# Patient Record
Sex: Male | Born: 1965 | Race: White | Hispanic: No | Marital: Married
Health system: Southern US, Community
[De-identification: ages and names within clinical notes are randomized; demographics above are authoritative.]

---

## 2019-02-28 ENCOUNTER — Other Ambulatory Visit (HOSPITAL_COMMUNITY): Payer: BC Managed Care – PPO

## 2019-02-28 ENCOUNTER — Inpatient Hospital Stay
Admit: 2019-02-28 | Discharge: 2019-04-23 | Disposition: A | Discharge status: Rehabilitation Facility | Payer: BC Managed Care – PPO | Source: Other Acute Inpatient Hospital | Attending: Internal Medicine | Admitting: Internal Medicine

## 2019-02-28 DIAGNOSIS — R609 Edema, unspecified: Secondary | ICD-10-CM

## 2019-02-28 DIAGNOSIS — K760 Fatty (change of) liver, not elsewhere classified: Secondary | ICD-10-CM

## 2019-02-28 DIAGNOSIS — R208 Other disturbances of skin sensation: Secondary | ICD-10-CM

## 2019-02-28 DIAGNOSIS — Z931 Gastrostomy status: Secondary | ICD-10-CM

## 2019-02-28 DIAGNOSIS — J189 Pneumonia, unspecified organism: Secondary | ICD-10-CM

## 2019-02-28 DIAGNOSIS — Z4659 Encounter for fitting and adjustment of other gastrointestinal appliance and device: Secondary | ICD-10-CM

## 2019-02-28 DIAGNOSIS — N289 Disorder of kidney and ureter, unspecified: Secondary | ICD-10-CM

## 2019-02-28 LAB — BLOOD GAS, ARTERIAL
Acid-Base Excess: 4.8 mmol/L — ABNORMAL HIGH (ref 0.0–2.0)
Bicarbonate: 28.9 mmol/L — ABNORMAL HIGH (ref 20.0–28.0)
O2 Content: 2 L/min
O2 Saturation: 94.7 %
Patient temperature: 98.6
pCO2 arterial: 43.9 mmHg (ref 32.0–48.0)
pH, Arterial: 7.434 (ref 7.350–7.450)
pO2, Arterial: 76.3 mmHg — ABNORMAL LOW (ref 83.0–108.0)

## 2019-03-01 ENCOUNTER — Other Ambulatory Visit (HOSPITAL_COMMUNITY): Payer: BC Managed Care – PPO

## 2019-03-01 LAB — COMPREHENSIVE METABOLIC PANEL
ALT: 62 U/L — ABNORMAL HIGH (ref 0–44)
AST: 126 U/L — ABNORMAL HIGH (ref 15–41)
Albumin: 1.5 g/dL — ABNORMAL LOW (ref 3.5–5.0)
Alkaline Phosphatase: 80 U/L (ref 38–126)
Anion gap: 4 — ABNORMAL LOW (ref 5–15)
BUN: 25 mg/dL — ABNORMAL HIGH (ref 6–20)
CO2: 27 mmol/L (ref 22–32)
Calcium: 9.3 mg/dL (ref 8.9–10.3)
Chloride: 114 mmol/L — ABNORMAL HIGH (ref 98–111)
Creatinine, Ser: 0.53 mg/dL — ABNORMAL LOW (ref 0.61–1.24)
GFR calc Af Amer: >60 mL/min (ref >60)
GFR calc non Af Amer: >60 mL/min (ref >60)
Glucose, Bld: 122 mg/dL — ABNORMAL HIGH (ref 70–99)
Potassium: 4.3 mmol/L (ref 3.5–5.1)
Sodium: 145 mmol/L (ref 135–145)
Total Bilirubin: 4.7 mg/dL — ABNORMAL HIGH (ref 0.3–1.2)
Total Protein: 7.6 g/dL (ref 6.5–8.1)

## 2019-03-01 LAB — URINALYSIS, ROUTINE W REFLEX MICROSCOPIC
Bilirubin Urine: NEGATIVE
Glucose, UA: NEGATIVE mg/dL
Ketones, ur: NEGATIVE mg/dL
Nitrite: NEGATIVE
Protein, ur: NEGATIVE mg/dL
Specific Gravity, Urine: 1.018 (ref 1.005–1.030)
pH: 7 (ref 5.0–8.0)

## 2019-03-01 LAB — CBC
HCT: 39.8 % (ref 39.0–52.0)
Hemoglobin: 13.2 g/dL (ref 13.0–17.0)
MCH: 33.2 pg (ref 26.0–34.0)
MCHC: 33.2 g/dL (ref 30.0–36.0)
MCV: 100 fL (ref 80.0–100.0)
Platelets: 129 10*3/uL — ABNORMAL LOW (ref 150–400)
RBC: 3.98 MIL/uL — ABNORMAL LOW (ref 4.22–5.81)
RDW: 16.4 % — ABNORMAL HIGH (ref 11.5–15.5)
WBC: 6.1 10*3/uL (ref 4.0–10.5)
nRBC: 0 % (ref 0.0–0.2)

## 2019-03-01 LAB — BLOOD GAS, ARTERIAL
Acid-Base Excess: 5.4 mmol/L — ABNORMAL HIGH (ref 0.0–2.0)
Bicarbonate: 29 mmol/L — ABNORMAL HIGH (ref 20.0–28.0)
O2 Content: 2 L/min
O2 Saturation: 92 %
Patient temperature: 98.6
pCO2 arterial: 38.9 mmHg (ref 32.0–48.0)
pH, Arterial: 7.485 — ABNORMAL HIGH (ref 7.350–7.450)
pO2, Arterial: 63 mmHg — ABNORMAL LOW (ref 83.0–108.0)

## 2019-03-01 LAB — AMMONIA: Ammonia: 133 umol/L — ABNORMAL HIGH (ref 9–35)

## 2019-03-01 LAB — PROTIME-INR
INR: 1.4 — ABNORMAL HIGH (ref 0.8–1.2)
Prothrombin Time: 17.1 seconds — ABNORMAL HIGH (ref 11.4–15.2)

## 2019-03-02 LAB — CBC
HCT: 41.2 % (ref 39.0–52.0)
Hemoglobin: 13.3 g/dL (ref 13.0–17.0)
MCH: 33.1 pg (ref 26.0–34.0)
MCHC: 32.3 g/dL (ref 30.0–36.0)
MCV: 102.5 fL — ABNORMAL HIGH (ref 80.0–100.0)
Platelets: 133 10*3/uL — ABNORMAL LOW (ref 150–400)
RBC: 4.02 MIL/uL — ABNORMAL LOW (ref 4.22–5.81)
RDW: 17 % — ABNORMAL HIGH (ref 11.5–15.5)
WBC: 6.3 10*3/uL (ref 4.0–10.5)
nRBC: 0 % (ref 0.0–0.2)

## 2019-03-02 LAB — BASIC METABOLIC PANEL
Anion gap: 6 (ref 5–15)
BUN: 24 mg/dL — ABNORMAL HIGH (ref 6–20)
CO2: 25 mmol/L (ref 22–32)
Calcium: 9.8 mg/dL (ref 8.9–10.3)
Chloride: 116 mmol/L — ABNORMAL HIGH (ref 98–111)
Creatinine, Ser: 0.59 mg/dL — ABNORMAL LOW (ref 0.61–1.24)
GFR calc Af Amer: >60 mL/min (ref >60)
GFR calc non Af Amer: >60 mL/min (ref >60)
Glucose, Bld: 136 mg/dL — ABNORMAL HIGH (ref 70–99)
Potassium: 4 mmol/L (ref 3.5–5.1)
Sodium: 147 mmol/L — ABNORMAL HIGH (ref 135–145)

## 2019-03-02 LAB — HEMOGLOBIN A1C
Hgb A1c MFr Bld: 5.5 % (ref 4.8–5.6)
Mean Plasma Glucose: 111.15 mg/dL

## 2019-03-02 LAB — AMMONIA: Ammonia: 142 umol/L — ABNORMAL HIGH (ref 9–35)

## 2019-03-02 LAB — MAGNESIUM: Magnesium: 1.5 mg/dL — ABNORMAL LOW (ref 1.7–2.4)

## 2019-03-02 LAB — PHOSPHORUS: Phosphorus: 3 mg/dL (ref 2.5–4.6)

## 2019-03-02 LAB — TSH: TSH: 5.113 u[IU]/mL — ABNORMAL HIGH (ref 0.350–4.500)

## 2019-03-03 ENCOUNTER — Other Ambulatory Visit (HOSPITAL_COMMUNITY): Payer: BC Managed Care – PPO

## 2019-03-03 ENCOUNTER — Other Ambulatory Visit (HOSPITAL_BASED_OUTPATIENT_CLINIC_OR_DEPARTMENT_OTHER): Payer: BC Managed Care – PPO

## 2019-03-03 DIAGNOSIS — R7881 Bacteremia: Secondary | ICD-10-CM | POA: Diagnosis not present

## 2019-03-03 LAB — CBC
HCT: 42.7 % (ref 39.0–52.0)
Hemoglobin: 13.9 g/dL (ref 13.0–17.0)
MCH: 33.4 pg (ref 26.0–34.0)
MCHC: 32.6 g/dL (ref 30.0–36.0)
MCV: 102.6 fL — ABNORMAL HIGH (ref 80.0–100.0)
Platelets: 129 10*3/uL — ABNORMAL LOW (ref 150–400)
RBC: 4.16 MIL/uL — ABNORMAL LOW (ref 4.22–5.81)
RDW: 17.2 % — ABNORMAL HIGH (ref 11.5–15.5)
WBC: 7.8 10*3/uL (ref 4.0–10.5)
nRBC: 0 % (ref 0.0–0.2)

## 2019-03-03 LAB — BLOOD CULTURE ID PANEL (REFLEXED)

## 2019-03-03 LAB — BASIC METABOLIC PANEL
Anion gap: 8 (ref 5–15)
BUN: 19 mg/dL (ref 6–20)
CO2: 24 mmol/L (ref 22–32)
Calcium: 10 mg/dL (ref 8.9–10.3)
Chloride: 114 mmol/L — ABNORMAL HIGH (ref 98–111)
Creatinine, Ser: 0.56 mg/dL — ABNORMAL LOW (ref 0.61–1.24)
GFR calc Af Amer: >60 mL/min (ref >60)
GFR calc non Af Amer: >60 mL/min (ref >60)
Glucose, Bld: 134 mg/dL — ABNORMAL HIGH (ref 70–99)
Potassium: 4 mmol/L (ref 3.5–5.1)
Sodium: 146 mmol/L — ABNORMAL HIGH (ref 135–145)

## 2019-03-03 LAB — AMMONIA: Ammonia: 134 umol/L — ABNORMAL HIGH (ref 9–35)

## 2019-03-03 LAB — PHOSPHORUS: Phosphorus: 3.4 mg/dL (ref 2.5–4.6)

## 2019-03-03 LAB — MAGNESIUM: Magnesium: 1.4 mg/dL — ABNORMAL LOW (ref 1.7–2.4)

## 2019-03-03 MED ORDER — PANTOPRAZOLE SODIUM 40 MG IV SOLR
40.00 | INTRAVENOUS | Status: DC
Start: 2019-03-01 — End: 2019-03-03

## 2019-03-03 MED ORDER — LIDOCAINE VISCOUS HCL 2 % MT SOLN
15.00 | OROMUCOSAL | Status: DC
Start: ? — End: 2019-03-03

## 2019-03-03 MED ORDER — HYDRALAZINE HCL 20 MG/ML IJ SOLN
10.00 | INTRAMUSCULAR | Status: DC
Start: ? — End: 2019-03-03

## 2019-03-03 MED ORDER — DEXTROSE 10 % IV SOLN
250.00 | INTRAVENOUS | Status: DC
Start: ? — End: 2019-03-03

## 2019-03-03 MED ORDER — BISACODYL 10 MG RE SUPP
10.00 | RECTAL | Status: DC
Start: ? — End: 2019-03-03

## 2019-03-03 MED ORDER — ONDANSETRON 4 MG PO TBDP
4.00 | ORAL_TABLET | ORAL | Status: DC
Start: ? — End: 2019-03-03

## 2019-03-03 MED ORDER — MULTI-VITAMIN PO TABS
1.00 | ORAL_TABLET | ORAL | Status: DC
Start: 2019-03-01 — End: 2019-03-03

## 2019-03-03 MED ORDER — HEPARIN SODIUM (PORCINE) 5000 UNIT/ML IJ SOLN
7500.00 | INTRAMUSCULAR | Status: DC
Start: 2019-02-28 — End: 2019-03-03

## 2019-03-03 MED ORDER — DEXTROSE 5 % IV SOLN
INTRAVENOUS | Status: DC
Start: ? — End: 2019-03-03

## 2019-03-03 MED ORDER — GENERIC EXTERNAL MEDICATION
Status: DC
Start: ? — End: 2019-03-03

## 2019-03-03 MED ORDER — VITAMIN B-1 100 MG PO TABS
100.00 | ORAL_TABLET | ORAL | Status: DC
Start: 2019-03-01 — End: 2019-03-03

## 2019-03-03 MED ORDER — SCOPOLAMINE 1 MG/3DAYS TD PT72
1.50 | MEDICATED_PATCH | TRANSDERMAL | Status: DC
Start: 2019-03-03 — End: 2019-03-03

## 2019-03-03 MED ORDER — PROSOURCE TF PO LIQD
90.00 | ORAL | Status: DC
Start: 2019-02-28 — End: 2019-03-03

## 2019-03-03 MED ORDER — MAGNESIUM SULFATE 4 GM/100ML IV SOLN
4.00 | INTRAVENOUS | Status: DC
Start: ? — End: 2019-03-03

## 2019-03-03 MED ORDER — HYOSCYAMINE SULFATE 0.125 MG/ML PO SOLN
0.13 | ORAL | Status: DC
Start: ? — End: 2019-03-03

## 2019-03-03 MED ORDER — GENERIC EXTERNAL MEDICATION
0.20 | Status: DC
Start: ? — End: 2019-03-03

## 2019-03-03 MED ORDER — ATROPINE SULFATE 0.5 MG/5ML IJ SOSY
0.50 | PREFILLED_SYRINGE | INTRAMUSCULAR | Status: DC
Start: ? — End: 2019-03-03

## 2019-03-03 MED ORDER — LACTULOSE 10 GM/15ML PO SOLN
30.00 | ORAL | Status: DC
Start: 2019-02-28 — End: 2019-03-03

## 2019-03-03 MED ORDER — LABETALOL HCL 5 MG/ML IV SOLN
10.00 | INTRAVENOUS | Status: DC
Start: ? — End: 2019-03-03

## 2019-03-03 MED ORDER — GENERIC EXTERNAL MEDICATION
12.00 | Status: DC
Start: ? — End: 2019-03-03

## 2019-03-03 MED ORDER — ONDANSETRON HCL 4 MG/2ML IJ SOLN
4.00 | INTRAMUSCULAR | Status: DC
Start: ? — End: 2019-03-03

## 2019-03-03 MED ORDER — GENERIC EXTERNAL MEDICATION
1.00 | Status: DC
Start: ? — End: 2019-03-03

## 2019-03-03 MED ORDER — SENNOSIDES-DOCUSATE SODIUM 8.6-50 MG PO TABS
1.00 | ORAL_TABLET | ORAL | Status: DC
Start: 2019-03-01 — End: 2019-03-03

## 2019-03-03 MED ORDER — EPINEPHRINE 1 MG/10ML IJ SOSY
1.00 | PREFILLED_SYRINGE | INTRAMUSCULAR | Status: DC
Start: ? — End: 2019-03-03

## 2019-03-03 MED ORDER — FOLIC ACID 1 MG PO TABS
1.00 | ORAL_TABLET | ORAL | Status: DC
Start: 2019-03-01 — End: 2019-03-03

## 2019-03-03 MED ORDER — SODIUM CHLORIDE 0.9 % IV SOLN
20.00 | INTRAVENOUS | Status: DC
Start: ? — End: 2019-03-03

## 2019-03-03 MED ORDER — RIFAXIMIN 550 MG PO TABS
550.00 | ORAL_TABLET | ORAL | Status: DC
Start: 2019-02-28 — End: 2019-03-03

## 2019-03-03 MED ORDER — IBUPROFEN 100 MG/5ML PO SUSP
800.00 | ORAL | Status: DC
Start: ? — End: 2019-03-03

## 2019-03-03 MED ORDER — INSULIN ASPART 100 UNIT/ML FLEXPEN
0.00 | PEN_INJECTOR | SUBCUTANEOUS | Status: DC
Start: 2019-02-28 — End: 2019-03-03

## 2019-03-03 MED ORDER — GENERIC EXTERNAL MEDICATION
4000.00 | Status: DC
Start: ? — End: 2019-03-03

## 2019-03-03 MED ORDER — DERMACERIN EX CREA
TOPICAL_CREAM | CUTANEOUS | Status: DC
Start: ? — End: 2019-03-03

## 2019-03-03 NOTE — Progress Notes (Signed)
  Echocardiogram 2D Echocardiogram has been performed.  Bobby Roach 03/03/2019, 5:21 PM

## 2019-03-04 LAB — CULTURE, BLOOD (ROUTINE X 2)

## 2019-03-04 LAB — BASIC METABOLIC PANEL
Anion gap: 5 (ref 5–15)
BUN: 20 mg/dL (ref 6–20)
CO2: 24 mmol/L (ref 22–32)
Calcium: 9.7 mg/dL (ref 8.9–10.3)
Chloride: 115 mmol/L — ABNORMAL HIGH (ref 98–111)
Creatinine, Ser: 0.66 mg/dL (ref 0.61–1.24)
GFR calc Af Amer: >60 mL/min (ref >60)
GFR calc non Af Amer: >60 mL/min (ref >60)
Glucose, Bld: 135 mg/dL — ABNORMAL HIGH (ref 70–99)
Potassium: 3.6 mmol/L (ref 3.5–5.1)
Sodium: 144 mmol/L (ref 135–145)

## 2019-03-04 LAB — CBC
HCT: 41 % (ref 39.0–52.0)
Hemoglobin: 13.5 g/dL (ref 13.0–17.0)
MCH: 33.8 pg (ref 26.0–34.0)
MCHC: 32.9 g/dL (ref 30.0–36.0)
MCV: 102.5 fL — ABNORMAL HIGH (ref 80.0–100.0)
Platelets: 135 10*3/uL — ABNORMAL LOW (ref 150–400)
RBC: 4 MIL/uL — ABNORMAL LOW (ref 4.22–5.81)
RDW: 16.8 % — ABNORMAL HIGH (ref 11.5–15.5)
WBC: 7.5 10*3/uL (ref 4.0–10.5)
nRBC: 0 % (ref 0.0–0.2)

## 2019-03-04 LAB — CULTURE, RESPIRATORY W GRAM STAIN
Culture: NORMAL
Gram Stain: NONE SEEN

## 2019-03-04 LAB — URINE CULTURE: Culture: >=100000 — AB

## 2019-03-04 LAB — VANCOMYCIN, TROUGH: Vancomycin Tr: 13 ug/mL — ABNORMAL LOW (ref 15–20)

## 2019-03-04 LAB — MAGNESIUM: Magnesium: 1.4 mg/dL — ABNORMAL LOW (ref 1.7–2.4)

## 2019-03-05 LAB — AMMONIA: Ammonia: 108 umol/L — ABNORMAL HIGH (ref 9–35)

## 2019-03-05 LAB — MAGNESIUM: Magnesium: 1.6 mg/dL — ABNORMAL LOW (ref 1.7–2.4)

## 2019-03-06 LAB — CULTURE, BLOOD (ROUTINE X 2): Culture: NO GROWTH

## 2019-03-06 LAB — BASIC METABOLIC PANEL
Anion gap: 9 (ref 5–15)
BUN: 21 mg/dL — ABNORMAL HIGH (ref 6–20)
CO2: 21 mmol/L — ABNORMAL LOW (ref 22–32)
Calcium: 9.8 mg/dL (ref 8.9–10.3)
Chloride: 114 mmol/L — ABNORMAL HIGH (ref 98–111)
Creatinine, Ser: 0.62 mg/dL (ref 0.61–1.24)
GFR calc Af Amer: >60 mL/min (ref >60)
GFR calc non Af Amer: >60 mL/min (ref >60)
Glucose, Bld: 103 mg/dL — ABNORMAL HIGH (ref 70–99)
Potassium: 4.6 mmol/L (ref 3.5–5.1)
Sodium: 144 mmol/L (ref 135–145)

## 2019-03-06 LAB — MAGNESIUM: Magnesium: 1.7 mg/dL (ref 1.7–2.4)

## 2019-03-07 LAB — MAGNESIUM: Magnesium: 1.4 mg/dL — ABNORMAL LOW (ref 1.7–2.4)

## 2019-03-07 LAB — AMMONIA: Ammonia: 49 umol/L — ABNORMAL HIGH (ref 9–35)

## 2019-03-08 LAB — CBC
HCT: 39.1 % (ref 39.0–52.0)
Hemoglobin: 13 g/dL (ref 13.0–17.0)
MCH: 34.5 pg — ABNORMAL HIGH (ref 26.0–34.0)
MCHC: 33.2 g/dL (ref 30.0–36.0)
MCV: 103.7 fL — ABNORMAL HIGH (ref 80.0–100.0)
Platelets: 111 10*3/uL — ABNORMAL LOW (ref 150–400)
RBC: 3.77 MIL/uL — ABNORMAL LOW (ref 4.22–5.81)
RDW: 16.4 % — ABNORMAL HIGH (ref 11.5–15.5)
WBC: 5.8 10*3/uL (ref 4.0–10.5)
nRBC: 0.7 % — ABNORMAL HIGH (ref 0.0–0.2)

## 2019-03-08 LAB — RENAL FUNCTION PANEL
Albumin: 1.5 g/dL — ABNORMAL LOW (ref 3.5–5.0)
Anion gap: 3 — ABNORMAL LOW (ref 5–15)
BUN: 20 mg/dL (ref 6–20)
CO2: 28 mmol/L (ref 22–32)
Calcium: 9.7 mg/dL (ref 8.9–10.3)
Chloride: 118 mmol/L — ABNORMAL HIGH (ref 98–111)
Creatinine, Ser: 0.45 mg/dL — ABNORMAL LOW (ref 0.61–1.24)
GFR calc Af Amer: >60 mL/min (ref >60)
GFR calc non Af Amer: >60 mL/min (ref >60)
Glucose, Bld: 133 mg/dL — ABNORMAL HIGH (ref 70–99)
Phosphorus: 3.6 mg/dL (ref 2.5–4.6)
Potassium: 4.4 mmol/L (ref 3.5–5.1)
Sodium: 149 mmol/L — ABNORMAL HIGH (ref 135–145)

## 2019-03-08 LAB — MAGNESIUM: Magnesium: 1.7 mg/dL (ref 1.7–2.4)

## 2019-03-09 LAB — BASIC METABOLIC PANEL
Anion gap: 7 (ref 5–15)
BUN: 16 mg/dL (ref 6–20)
CO2: 28 mmol/L (ref 22–32)
Calcium: 9.4 mg/dL (ref 8.9–10.3)
Chloride: 114 mmol/L — ABNORMAL HIGH (ref 98–111)
Creatinine, Ser: 0.47 mg/dL — ABNORMAL LOW (ref 0.61–1.24)
GFR calc Af Amer: >60 mL/min (ref >60)
GFR calc non Af Amer: >60 mL/min (ref >60)
Glucose, Bld: 120 mg/dL — ABNORMAL HIGH (ref 70–99)
Potassium: 3.7 mmol/L (ref 3.5–5.1)
Sodium: 149 mmol/L — ABNORMAL HIGH (ref 135–145)

## 2019-03-09 LAB — CULTURE, BLOOD (ROUTINE X 2)
Culture: NO GROWTH
Culture: NO GROWTH

## 2019-03-09 LAB — MAGNESIUM: Magnesium: 1.5 mg/dL — ABNORMAL LOW (ref 1.7–2.4)

## 2019-03-09 LAB — AMMONIA: Ammonia: 48 umol/L — ABNORMAL HIGH (ref 9–35)

## 2019-03-10 LAB — CBC
HCT: 39.4 % (ref 39.0–52.0)
Hemoglobin: 12.9 g/dL — ABNORMAL LOW (ref 13.0–17.0)
MCH: 34 pg (ref 26.0–34.0)
MCHC: 32.7 g/dL (ref 30.0–36.0)
MCV: 104 fL — ABNORMAL HIGH (ref 80.0–100.0)
Platelets: 93 10*3/uL — ABNORMAL LOW (ref 150–400)
RBC: 3.79 MIL/uL — ABNORMAL LOW (ref 4.22–5.81)
RDW: 15.7 % — ABNORMAL HIGH (ref 11.5–15.5)
WBC: 5.3 10*3/uL (ref 4.0–10.5)
nRBC: 0 % (ref 0.0–0.2)

## 2019-03-10 LAB — BASIC METABOLIC PANEL
Anion gap: 5 (ref 5–15)
BUN: 13 mg/dL (ref 6–20)
CO2: 28 mmol/L (ref 22–32)
Calcium: 9.3 mg/dL (ref 8.9–10.3)
Chloride: 113 mmol/L — ABNORMAL HIGH (ref 98–111)
Creatinine, Ser: 0.47 mg/dL — ABNORMAL LOW (ref 0.61–1.24)
GFR calc Af Amer: >60 mL/min (ref >60)
GFR calc non Af Amer: >60 mL/min (ref >60)
Glucose, Bld: 112 mg/dL — ABNORMAL HIGH (ref 70–99)
Potassium: 3.8 mmol/L (ref 3.5–5.1)
Sodium: 146 mmol/L — ABNORMAL HIGH (ref 135–145)

## 2019-03-10 LAB — MAGNESIUM: Magnesium: 1.5 mg/dL — ABNORMAL LOW (ref 1.7–2.4)

## 2019-03-11 LAB — BASIC METABOLIC PANEL
Anion gap: 4 — ABNORMAL LOW (ref 5–15)
BUN: 14 mg/dL (ref 6–20)
CO2: 25 mmol/L (ref 22–32)
Calcium: 8.8 mg/dL — ABNORMAL LOW (ref 8.9–10.3)
Chloride: 113 mmol/L — ABNORMAL HIGH (ref 98–111)
Creatinine, Ser: 0.4 mg/dL — ABNORMAL LOW (ref 0.61–1.24)
GFR calc Af Amer: >60 mL/min (ref >60)
GFR calc non Af Amer: >60 mL/min (ref >60)
Glucose, Bld: 102 mg/dL — ABNORMAL HIGH (ref 70–99)
Potassium: 3.8 mmol/L (ref 3.5–5.1)
Sodium: 142 mmol/L (ref 135–145)

## 2019-03-11 LAB — PHOSPHORUS: Phosphorus: 3.8 mg/dL (ref 2.5–4.6)

## 2019-03-11 LAB — CBC
HCT: 38.3 % — ABNORMAL LOW (ref 39.0–52.0)
Hemoglobin: 12.5 g/dL — ABNORMAL LOW (ref 13.0–17.0)
MCH: 34 pg (ref 26.0–34.0)
MCHC: 32.6 g/dL (ref 30.0–36.0)
MCV: 104.1 fL — ABNORMAL HIGH (ref 80.0–100.0)
Platelets: 93 10*3/uL — ABNORMAL LOW (ref 150–400)
RBC: 3.68 MIL/uL — ABNORMAL LOW (ref 4.22–5.81)
RDW: 15.9 % — ABNORMAL HIGH (ref 11.5–15.5)
WBC: 5.4 10*3/uL (ref 4.0–10.5)
nRBC: 0 % (ref 0.0–0.2)

## 2019-03-11 LAB — MAGNESIUM: Magnesium: 1.5 mg/dL — ABNORMAL LOW (ref 1.7–2.4)

## 2019-03-11 LAB — AMMONIA: Ammonia: 148 umol/L — ABNORMAL HIGH (ref 9–35)

## 2019-03-12 ENCOUNTER — Other Ambulatory Visit (HOSPITAL_COMMUNITY): Payer: BC Managed Care – PPO

## 2019-03-12 LAB — CBC
HCT: 37.8 % — ABNORMAL LOW (ref 39.0–52.0)
Hemoglobin: 12.4 g/dL — ABNORMAL LOW (ref 13.0–17.0)
MCH: 33.9 pg (ref 26.0–34.0)
MCHC: 32.8 g/dL (ref 30.0–36.0)
MCV: 103.3 fL — ABNORMAL HIGH (ref 80.0–100.0)
Platelets: 94 K/uL — ABNORMAL LOW (ref 150–400)
RBC: 3.66 MIL/uL — ABNORMAL LOW (ref 4.22–5.81)
RDW: 16 % — ABNORMAL HIGH (ref 11.5–15.5)
WBC: 5.8 K/uL (ref 4.0–10.5)
nRBC: 0.3 % — ABNORMAL HIGH (ref 0.0–0.2)

## 2019-03-12 LAB — BASIC METABOLIC PANEL
Anion gap: 7 (ref 5–15)
BUN: 17 mg/dL (ref 6–20)
CO2: 26 mmol/L (ref 22–32)
Calcium: 8.9 mg/dL (ref 8.9–10.3)
Chloride: 111 mmol/L (ref 98–111)
Creatinine, Ser: 0.49 mg/dL — ABNORMAL LOW (ref 0.61–1.24)
GFR calc Af Amer: >60 mL/min (ref >60)
GFR calc non Af Amer: >60 mL/min (ref >60)
Glucose, Bld: 141 mg/dL — ABNORMAL HIGH (ref 70–99)
Potassium: 3.7 mmol/L (ref 3.5–5.1)
Sodium: 144 mmol/L (ref 135–145)

## 2019-03-12 LAB — MAGNESIUM: Magnesium: 1.6 mg/dL — ABNORMAL LOW (ref 1.7–2.4)

## 2019-03-12 LAB — PHOSPHORUS: Phosphorus: 4 mg/dL (ref 2.5–4.6)

## 2019-03-12 LAB — AMMONIA: Ammonia: 93 umol/L — ABNORMAL HIGH (ref 9–35)

## 2019-03-12 MED ORDER — GENERIC EXTERNAL MEDICATION
Status: DC
Start: ? — End: 2019-03-12

## 2019-03-12 MED ORDER — HYDRALAZINE HCL 20 MG/ML IJ SOLN
10.00 | INTRAMUSCULAR | Status: DC
Start: ? — End: 2019-03-12

## 2019-03-12 NOTE — Progress Notes (Signed)
CT Abdomen ordered for review of anatomy for possible gastrostomy tube placement in IR.  Brynda Greathouse, MS RD PA-C

## 2019-03-13 LAB — BASIC METABOLIC PANEL
Anion gap: 6 (ref 5–15)
BUN: 17 mg/dL (ref 6–20)
CO2: 23 mmol/L (ref 22–32)
Calcium: 8.6 mg/dL — ABNORMAL LOW (ref 8.9–10.3)
Chloride: 113 mmol/L — ABNORMAL HIGH (ref 98–111)
Creatinine, Ser: 0.48 mg/dL — ABNORMAL LOW (ref 0.61–1.24)
GFR calc Af Amer: >60 mL/min (ref >60)
GFR calc non Af Amer: >60 mL/min (ref >60)
Glucose, Bld: 138 mg/dL — ABNORMAL HIGH (ref 70–99)
Potassium: 4 mmol/L (ref 3.5–5.1)
Sodium: 142 mmol/L (ref 135–145)

## 2019-03-13 LAB — HEPARIN INDUCED PLATELET AB (HIT ANTIBODY): Heparin Induced Plt Ab: 0.073 OD (ref 0.000–0.400)

## 2019-03-13 LAB — AMMONIA: Ammonia: 98 umol/L — ABNORMAL HIGH (ref 9–35)

## 2019-03-13 LAB — MAGNESIUM: Magnesium: 1.6 mg/dL — ABNORMAL LOW (ref 1.7–2.4)

## 2019-03-13 NOTE — Progress Notes (Signed)
CT Abdomen for review of anatomy for gastrostomy tube placement as follows:  IMPRESSION: 1. Poor candidate for gastrostomy tube placement secondary to cirrhosis and intra-abdominal ascites as gastric anatomy including the depth of the stomach and partial transposition of the transverse Colon.  No procedure planned in IR at this time.   Brynda Greathouse, MS RD PA-C

## 2019-03-14 LAB — MAGNESIUM: Magnesium: 1.5 mg/dL — ABNORMAL LOW (ref 1.7–2.4)

## 2019-03-15 LAB — MAGNESIUM
Magnesium: 1.7 mg/dL (ref 1.7–2.4)
Magnesium: 1.7 mg/dL (ref 1.7–2.4)

## 2019-03-15 LAB — AMMONIA: Ammonia: 138 umol/L — ABNORMAL HIGH (ref 9–35)

## 2019-03-16 LAB — MAGNESIUM
Magnesium: 1.7 mg/dL (ref 1.7–2.4)
Magnesium: 1.6 mg/dL — ABNORMAL LOW (ref 1.7–2.4)

## 2019-03-16 LAB — AMMONIA: Ammonia: 66 umol/L — ABNORMAL HIGH (ref 9–35)

## 2019-03-17 LAB — MAGNESIUM
Magnesium: 1.6 mg/dL — ABNORMAL LOW (ref 1.7–2.4)
Magnesium: 1.6 mg/dL — ABNORMAL LOW (ref 1.7–2.4)

## 2019-03-17 LAB — AMMONIA: Ammonia: 60 umol/L — ABNORMAL HIGH (ref 9–35)

## 2019-03-18 LAB — CBC
HCT: 34.7 % — ABNORMAL LOW (ref 39.0–52.0)
Hemoglobin: 11.6 g/dL — ABNORMAL LOW (ref 13.0–17.0)
MCH: 34.3 pg — ABNORMAL HIGH (ref 26.0–34.0)
MCHC: 33.4 g/dL (ref 30.0–36.0)
MCV: 102.7 fL — ABNORMAL HIGH (ref 80.0–100.0)
Platelets: 102 10*3/uL — ABNORMAL LOW (ref 150–400)
RBC: 3.38 MIL/uL — ABNORMAL LOW (ref 4.22–5.81)
RDW: 15.1 % (ref 11.5–15.5)
WBC: 6 10*3/uL (ref 4.0–10.5)
nRBC: 0 % (ref 0.0–0.2)

## 2019-03-18 LAB — BASIC METABOLIC PANEL
Anion gap: 5 (ref 5–15)
BUN: 11 mg/dL (ref 6–20)
CO2: 21 mmol/L — ABNORMAL LOW (ref 22–32)
Calcium: 8.3 mg/dL — ABNORMAL LOW (ref 8.9–10.3)
Chloride: 113 mmol/L — ABNORMAL HIGH (ref 98–111)
Creatinine, Ser: 0.48 mg/dL — ABNORMAL LOW (ref 0.61–1.24)
GFR calc Af Amer: >60 mL/min (ref >60)
GFR calc non Af Amer: >60 mL/min (ref >60)
Glucose, Bld: 135 mg/dL — ABNORMAL HIGH (ref 70–99)
Potassium: 3.7 mmol/L (ref 3.5–5.1)
Sodium: 139 mmol/L (ref 135–145)

## 2019-03-18 LAB — AMMONIA: Ammonia: 73 umol/L — ABNORMAL HIGH (ref 9–35)

## 2019-03-18 LAB — MAGNESIUM: Magnesium: 1.4 mg/dL — ABNORMAL LOW (ref 1.7–2.4)

## 2019-03-19 LAB — BLOOD GAS, ARTERIAL
Acid-Base Excess: 0.6 mmol/L (ref 0.0–2.0)
Bicarbonate: 24.2 mmol/L (ref 20.0–28.0)
Delivery systems: POSITIVE
Expiratory PAP: 6
FIO2: 28
Inspiratory PAP: 10
O2 Saturation: 96.3 %
Patient temperature: 98.6
pCO2 arterial: 35.7 mmHg (ref 32.0–48.0)
pH, Arterial: 7.446 (ref 7.350–7.450)
pO2, Arterial: 85.1 mmHg (ref 83.0–108.0)

## 2019-03-19 LAB — MAGNESIUM: Magnesium: 1.5 mg/dL — ABNORMAL LOW (ref 1.7–2.4)

## 2019-03-19 LAB — AMMONIA: Ammonia: 67 umol/L — ABNORMAL HIGH (ref 9–35)

## 2019-03-20 LAB — MAGNESIUM: Magnesium: 1.3 mg/dL — ABNORMAL LOW (ref 1.7–2.4)

## 2019-03-21 LAB — CBC
HCT: 31.2 % — ABNORMAL LOW (ref 39.0–52.0)
Hemoglobin: 10.7 g/dL — ABNORMAL LOW (ref 13.0–17.0)
MCH: 34.1 pg — ABNORMAL HIGH (ref 26.0–34.0)
MCHC: 34.3 g/dL (ref 30.0–36.0)
MCV: 99.4 fL (ref 80.0–100.0)
Platelets: 97 10*3/uL — ABNORMAL LOW (ref 150–400)
RBC: 3.14 MIL/uL — ABNORMAL LOW (ref 4.22–5.81)
RDW: 14.6 % (ref 11.5–15.5)
WBC: 5.2 10*3/uL (ref 4.0–10.5)
nRBC: 0 % (ref 0.0–0.2)

## 2019-03-21 LAB — BASIC METABOLIC PANEL
Anion gap: 5 (ref 5–15)
BUN: 8 mg/dL (ref 6–20)
CO2: 24 mmol/L (ref 22–32)
Calcium: 8.1 mg/dL — ABNORMAL LOW (ref 8.9–10.3)
Chloride: 105 mmol/L (ref 98–111)
Creatinine, Ser: 0.44 mg/dL — ABNORMAL LOW (ref 0.61–1.24)
GFR calc Af Amer: >60 mL/min (ref >60)
GFR calc non Af Amer: >60 mL/min (ref >60)
Glucose, Bld: 107 mg/dL — ABNORMAL HIGH (ref 70–99)
Potassium: 3.6 mmol/L (ref 3.5–5.1)
Sodium: 134 mmol/L — ABNORMAL LOW (ref 135–145)

## 2019-03-21 LAB — AMMONIA: Ammonia: 54 umol/L — ABNORMAL HIGH (ref 9–35)

## 2019-03-21 LAB — MAGNESIUM: Magnesium: 1.4 mg/dL — ABNORMAL LOW (ref 1.7–2.4)

## 2019-03-22 LAB — MAGNESIUM: Magnesium: 1.3 mg/dL — ABNORMAL LOW (ref 1.7–2.4)

## 2019-03-23 LAB — MAGNESIUM: Magnesium: 1.4 mg/dL — ABNORMAL LOW (ref 1.7–2.4)

## 2019-03-24 LAB — CBC
HCT: 31.6 % — ABNORMAL LOW (ref 39.0–52.0)
Hemoglobin: 10.8 g/dL — ABNORMAL LOW (ref 13.0–17.0)
MCH: 34.2 pg — ABNORMAL HIGH (ref 26.0–34.0)
MCHC: 34.2 g/dL (ref 30.0–36.0)
MCV: 100 fL (ref 80.0–100.0)
Platelets: 113 10*3/uL — ABNORMAL LOW (ref 150–400)
RBC: 3.16 MIL/uL — ABNORMAL LOW (ref 4.22–5.81)
RDW: 14.3 % (ref 11.5–15.5)
WBC: 5.6 10*3/uL (ref 4.0–10.5)
nRBC: 0 % (ref 0.0–0.2)

## 2019-03-24 LAB — BASIC METABOLIC PANEL
Anion gap: 8 (ref 5–15)
BUN: 8 mg/dL (ref 6–20)
CO2: 22 mmol/L (ref 22–32)
Calcium: 8 mg/dL — ABNORMAL LOW (ref 8.9–10.3)
Chloride: 103 mmol/L (ref 98–111)
Creatinine, Ser: 0.41 mg/dL — ABNORMAL LOW (ref 0.61–1.24)
GFR calc Af Amer: >60 mL/min (ref >60)
GFR calc non Af Amer: >60 mL/min (ref >60)
Glucose, Bld: 137 mg/dL — ABNORMAL HIGH (ref 70–99)
Potassium: 3.8 mmol/L (ref 3.5–5.1)
Sodium: 133 mmol/L — ABNORMAL LOW (ref 135–145)

## 2019-03-24 LAB — AMMONIA: Ammonia: 130 umol/L — ABNORMAL HIGH (ref 9–35)

## 2019-03-24 LAB — MAGNESIUM: Magnesium: 1.2 mg/dL — ABNORMAL LOW (ref 1.7–2.4)

## 2019-03-25 LAB — MAGNESIUM: Magnesium: 1.4 mg/dL — ABNORMAL LOW (ref 1.7–2.4)

## 2019-03-26 LAB — COMPREHENSIVE METABOLIC PANEL
ALT: 49 U/L — ABNORMAL HIGH (ref 0–44)
AST: 61 U/L — ABNORMAL HIGH (ref 15–41)
Albumin: 1.7 g/dL — ABNORMAL LOW (ref 3.5–5.0)
Alkaline Phosphatase: 124 U/L (ref 38–126)
Anion gap: 7 (ref 5–15)
BUN: <5 mg/dL — ABNORMAL LOW (ref 6–20)
CO2: 25 mmol/L (ref 22–32)
Calcium: 8.4 mg/dL — ABNORMAL LOW (ref 8.9–10.3)
Chloride: 104 mmol/L (ref 98–111)
Creatinine, Ser: 0.34 mg/dL — ABNORMAL LOW (ref 0.61–1.24)
GFR calc Af Amer: >60 mL/min (ref >60)
GFR calc non Af Amer: >60 mL/min (ref >60)
Glucose, Bld: 108 mg/dL — ABNORMAL HIGH (ref 70–99)
Potassium: 3.7 mmol/L (ref 3.5–5.1)
Sodium: 136 mmol/L (ref 135–145)
Total Bilirubin: 2 mg/dL — ABNORMAL HIGH (ref 0.3–1.2)
Total Protein: 7.1 g/dL (ref 6.5–8.1)

## 2019-03-26 LAB — CBC
HCT: 31.3 % — ABNORMAL LOW (ref 39.0–52.0)
Hemoglobin: 10.8 g/dL — ABNORMAL LOW (ref 13.0–17.0)
MCH: 34.5 pg — ABNORMAL HIGH (ref 26.0–34.0)
MCHC: 34.5 g/dL (ref 30.0–36.0)
MCV: 100 fL (ref 80.0–100.0)
Platelets: 111 10*3/uL — ABNORMAL LOW (ref 150–400)
RBC: 3.13 MIL/uL — ABNORMAL LOW (ref 4.22–5.81)
RDW: 14.2 % (ref 11.5–15.5)
WBC: 5 10*3/uL (ref 4.0–10.5)
nRBC: 0 % (ref 0.0–0.2)

## 2019-03-26 LAB — PHOSPHORUS: Phosphorus: 4.1 mg/dL (ref 2.5–4.6)

## 2019-03-26 LAB — AMMONIA: Ammonia: 67 umol/L — ABNORMAL HIGH (ref 9–35)

## 2019-03-26 LAB — MAGNESIUM: Magnesium: 1.3 mg/dL — ABNORMAL LOW (ref 1.7–2.4)

## 2019-03-27 LAB — MAGNESIUM: Magnesium: 1.2 mg/dL — ABNORMAL LOW (ref 1.7–2.4)

## 2019-03-28 LAB — CBC
HCT: 29.5 % — ABNORMAL LOW (ref 39.0–52.0)
Hemoglobin: 10.2 g/dL — ABNORMAL LOW (ref 13.0–17.0)
MCH: 35.1 pg — ABNORMAL HIGH (ref 26.0–34.0)
MCHC: 34.6 g/dL (ref 30.0–36.0)
MCV: 101.4 fL — ABNORMAL HIGH (ref 80.0–100.0)
Platelets: 102 10*3/uL — ABNORMAL LOW (ref 150–400)
RBC: 2.91 MIL/uL — ABNORMAL LOW (ref 4.22–5.81)
RDW: 14 % (ref 11.5–15.5)
WBC: 4.5 10*3/uL (ref 4.0–10.5)
nRBC: 0 % (ref 0.0–0.2)

## 2019-03-28 LAB — BASIC METABOLIC PANEL
Anion gap: 4 — ABNORMAL LOW (ref 5–15)
BUN: 5 mg/dL — ABNORMAL LOW (ref 6–20)
CO2: 27 mmol/L (ref 22–32)
Calcium: 8 mg/dL — ABNORMAL LOW (ref 8.9–10.3)
Chloride: 105 mmol/L (ref 98–111)
Creatinine, Ser: 0.45 mg/dL — ABNORMAL LOW (ref 0.61–1.24)
GFR calc Af Amer: >60 mL/min (ref >60)
GFR calc non Af Amer: >60 mL/min (ref >60)
Glucose, Bld: 131 mg/dL — ABNORMAL HIGH (ref 70–99)
Potassium: 3.6 mmol/L (ref 3.5–5.1)
Sodium: 136 mmol/L (ref 135–145)

## 2019-03-28 LAB — MAGNESIUM: Magnesium: 1.3 mg/dL — ABNORMAL LOW (ref 1.7–2.4)

## 2019-03-29 LAB — MAGNESIUM: Magnesium: 1.3 mg/dL — ABNORMAL LOW (ref 1.7–2.4)

## 2019-03-29 LAB — AMMONIA: Ammonia: 56 umol/L — ABNORMAL HIGH (ref 9–35)

## 2019-03-30 LAB — MAGNESIUM: Magnesium: 1.4 mg/dL — ABNORMAL LOW (ref 1.7–2.4)

## 2019-03-31 LAB — CBC
HCT: 31.6 % — ABNORMAL LOW (ref 39.0–52.0)
Hemoglobin: 10.8 g/dL — ABNORMAL LOW (ref 13.0–17.0)
MCH: 34.2 pg — ABNORMAL HIGH (ref 26.0–34.0)
MCHC: 34.2 g/dL (ref 30.0–36.0)
MCV: 100 fL (ref 80.0–100.0)
Platelets: 126 10*3/uL — ABNORMAL LOW (ref 150–400)
RBC: 3.16 MIL/uL — ABNORMAL LOW (ref 4.22–5.81)
RDW: 13.8 % (ref 11.5–15.5)
WBC: 5.5 10*3/uL (ref 4.0–10.5)
nRBC: 0 % (ref 0.0–0.2)

## 2019-03-31 LAB — BASIC METABOLIC PANEL
Anion gap: 7 (ref 5–15)
BUN: 6 mg/dL (ref 6–20)
CO2: 24 mmol/L (ref 22–32)
Calcium: 8 mg/dL — ABNORMAL LOW (ref 8.9–10.3)
Chloride: 104 mmol/L (ref 98–111)
Creatinine, Ser: 0.45 mg/dL — ABNORMAL LOW (ref 0.61–1.24)
GFR calc Af Amer: >60 mL/min (ref >60)
GFR calc non Af Amer: >60 mL/min (ref >60)
Glucose, Bld: 121 mg/dL — ABNORMAL HIGH (ref 70–99)
Potassium: 4 mmol/L (ref 3.5–5.1)
Sodium: 135 mmol/L (ref 135–145)

## 2019-03-31 LAB — MAGNESIUM: Magnesium: 1.5 mg/dL — ABNORMAL LOW (ref 1.7–2.4)

## 2019-03-31 LAB — AMMONIA: Ammonia: 77 umol/L — ABNORMAL HIGH (ref 9–35)

## 2019-03-31 LAB — PHOSPHORUS: Phosphorus: 3.8 mg/dL (ref 2.5–4.6)

## 2019-04-01 LAB — MAGNESIUM: Magnesium: 1.5 mg/dL — ABNORMAL LOW (ref 1.7–2.4)

## 2019-04-02 LAB — AMMONIA: Ammonia: 53 umol/L — ABNORMAL HIGH (ref 9–35)

## 2019-04-02 LAB — MAGNESIUM: Magnesium: 1.4 mg/dL — ABNORMAL LOW (ref 1.7–2.4)

## 2019-04-03 LAB — MAGNESIUM: Magnesium: 1.4 mg/dL — ABNORMAL LOW (ref 1.7–2.4)

## 2019-04-04 LAB — BASIC METABOLIC PANEL
Anion gap: 6 (ref 5–15)
BUN: 6 mg/dL (ref 6–20)
CO2: 25 mmol/L (ref 22–32)
Calcium: 8.3 mg/dL — ABNORMAL LOW (ref 8.9–10.3)
Chloride: 104 mmol/L (ref 98–111)
Creatinine, Ser: 0.43 mg/dL — ABNORMAL LOW (ref 0.61–1.24)
GFR calc Af Amer: >60 mL/min (ref >60)
GFR calc non Af Amer: >60 mL/min (ref >60)
Glucose, Bld: 101 mg/dL — ABNORMAL HIGH (ref 70–99)
Potassium: 3.8 mmol/L (ref 3.5–5.1)
Sodium: 135 mmol/L (ref 135–145)

## 2019-04-04 LAB — MAGNESIUM: Magnesium: 1.6 mg/dL — ABNORMAL LOW (ref 1.7–2.4)

## 2019-04-05 LAB — MAGNESIUM: Magnesium: 1.4 mg/dL — ABNORMAL LOW (ref 1.7–2.4)

## 2019-04-06 LAB — RENAL FUNCTION PANEL
Albumin: 1.8 g/dL — ABNORMAL LOW (ref 3.5–5.0)
Anion gap: 6 (ref 5–15)
BUN: <5 mg/dL — ABNORMAL LOW (ref 6–20)
CO2: 25 mmol/L (ref 22–32)
Calcium: 8.3 mg/dL — ABNORMAL LOW (ref 8.9–10.3)
Chloride: 104 mmol/L (ref 98–111)
Creatinine, Ser: 0.46 mg/dL — ABNORMAL LOW (ref 0.61–1.24)
GFR calc Af Amer: >60 mL/min (ref >60)
GFR calc non Af Amer: >60 mL/min (ref >60)
Glucose, Bld: 105 mg/dL — ABNORMAL HIGH (ref 70–99)
Phosphorus: 4.2 mg/dL (ref 2.5–4.6)
Potassium: 3.7 mmol/L (ref 3.5–5.1)
Sodium: 135 mmol/L (ref 135–145)

## 2019-04-06 LAB — CBC
HCT: 29.9 % — ABNORMAL LOW (ref 39.0–52.0)
Hemoglobin: 10.6 g/dL — ABNORMAL LOW (ref 13.0–17.0)
MCH: 35.2 pg — ABNORMAL HIGH (ref 26.0–34.0)
MCHC: 35.5 g/dL (ref 30.0–36.0)
MCV: 99.3 fL (ref 80.0–100.0)
Platelets: 120 10*3/uL — ABNORMAL LOW (ref 150–400)
RBC: 3.01 MIL/uL — ABNORMAL LOW (ref 4.22–5.81)
RDW: 14 % (ref 11.5–15.5)
WBC: 5 10*3/uL (ref 4.0–10.5)
nRBC: 0 % (ref 0.0–0.2)

## 2019-04-06 LAB — MAGNESIUM: Magnesium: 1.3 mg/dL — ABNORMAL LOW (ref 1.7–2.4)

## 2019-04-07 LAB — MAGNESIUM: Magnesium: 1.3 mg/dL — ABNORMAL LOW (ref 1.7–2.4)

## 2019-04-07 LAB — AMMONIA: Ammonia: 69 umol/L — ABNORMAL HIGH (ref 9–35)

## 2019-04-08 LAB — BASIC METABOLIC PANEL
Anion gap: 6 (ref 5–15)
BUN: <5 mg/dL — ABNORMAL LOW (ref 6–20)
CO2: 25 mmol/L (ref 22–32)
Calcium: 8.3 mg/dL — ABNORMAL LOW (ref 8.9–10.3)
Chloride: 104 mmol/L (ref 98–111)
Creatinine, Ser: 0.5 mg/dL — ABNORMAL LOW (ref 0.61–1.24)
GFR calc Af Amer: >60 mL/min (ref >60)
GFR calc non Af Amer: >60 mL/min (ref >60)
Glucose, Bld: 98 mg/dL (ref 70–99)
Potassium: 3.6 mmol/L (ref 3.5–5.1)
Sodium: 135 mmol/L (ref 135–145)

## 2019-04-08 LAB — MAGNESIUM: Magnesium: 1.3 mg/dL — ABNORMAL LOW (ref 1.7–2.4)

## 2019-04-09 LAB — CBC
HCT: 32.8 % — ABNORMAL LOW (ref 39.0–52.0)
Hemoglobin: 11.1 g/dL — ABNORMAL LOW (ref 13.0–17.0)
MCH: 33.5 pg (ref 26.0–34.0)
MCHC: 33.8 g/dL (ref 30.0–36.0)
MCV: 99.1 fL (ref 80.0–100.0)
Platelets: 141 10*3/uL — ABNORMAL LOW (ref 150–400)
RBC: 3.31 MIL/uL — ABNORMAL LOW (ref 4.22–5.81)
RDW: 13.6 % (ref 11.5–15.5)
WBC: 7.1 10*3/uL (ref 4.0–10.5)
nRBC: 0 % (ref 0.0–0.2)

## 2019-04-09 LAB — BASIC METABOLIC PANEL
Anion gap: 10 (ref 5–15)
BUN: <5 mg/dL — ABNORMAL LOW (ref 6–20)
CO2: 23 mmol/L (ref 22–32)
Calcium: 8.2 mg/dL — ABNORMAL LOW (ref 8.9–10.3)
Chloride: 101 mmol/L (ref 98–111)
Creatinine, Ser: 0.44 mg/dL — ABNORMAL LOW (ref 0.61–1.24)
GFR calc Af Amer: >60 mL/min (ref >60)
GFR calc non Af Amer: >60 mL/min (ref >60)
Glucose, Bld: 104 mg/dL — ABNORMAL HIGH (ref 70–99)
Potassium: 3.8 mmol/L (ref 3.5–5.1)
Sodium: 134 mmol/L — ABNORMAL LOW (ref 135–145)

## 2019-04-09 LAB — MAGNESIUM: Magnesium: 1.4 mg/dL — ABNORMAL LOW (ref 1.7–2.4)

## 2019-04-09 LAB — PHOSPHORUS: Phosphorus: 4.4 mg/dL (ref 2.5–4.6)

## 2019-04-10 LAB — BASIC METABOLIC PANEL
Anion gap: 5 (ref 5–15)
BUN: <5 mg/dL — ABNORMAL LOW (ref 6–20)
CO2: 26 mmol/L (ref 22–32)
Calcium: 8.2 mg/dL — ABNORMAL LOW (ref 8.9–10.3)
Chloride: 106 mmol/L (ref 98–111)
Creatinine, Ser: 0.52 mg/dL — ABNORMAL LOW (ref 0.61–1.24)
GFR calc Af Amer: >60 mL/min (ref >60)
GFR calc non Af Amer: >60 mL/min (ref >60)
Glucose, Bld: 105 mg/dL — ABNORMAL HIGH (ref 70–99)
Potassium: 3.7 mmol/L (ref 3.5–5.1)
Sodium: 137 mmol/L (ref 135–145)

## 2019-04-10 LAB — AMMONIA: Ammonia: 65 umol/L — ABNORMAL HIGH (ref 9–35)

## 2019-04-10 LAB — MAGNESIUM: Magnesium: 1.5 mg/dL — ABNORMAL LOW (ref 1.7–2.4)

## 2019-04-11 LAB — MAGNESIUM: Magnesium: 1.4 mg/dL — ABNORMAL LOW (ref 1.7–2.4)

## 2019-04-13 LAB — AMMONIA: Ammonia: 66 umol/L — ABNORMAL HIGH (ref 9–35)

## 2019-04-13 LAB — BASIC METABOLIC PANEL
Anion gap: 6 (ref 5–15)
BUN: 5 mg/dL — ABNORMAL LOW (ref 6–20)
CO2: 23 mmol/L (ref 22–32)
Calcium: 8.1 mg/dL — ABNORMAL LOW (ref 8.9–10.3)
Chloride: 108 mmol/L (ref 98–111)
Creatinine, Ser: 0.53 mg/dL — ABNORMAL LOW (ref 0.61–1.24)
GFR calc Af Amer: >60 mL/min (ref >60)
GFR calc non Af Amer: >60 mL/min (ref >60)
Glucose, Bld: 116 mg/dL — ABNORMAL HIGH (ref 70–99)
Potassium: 3.6 mmol/L (ref 3.5–5.1)
Sodium: 137 mmol/L (ref 135–145)

## 2019-04-13 LAB — MAGNESIUM: Magnesium: 1.3 mg/dL — ABNORMAL LOW (ref 1.7–2.4)

## 2019-04-14 LAB — CBC
HCT: 33.7 % — ABNORMAL LOW (ref 39.0–52.0)
Hemoglobin: 11.6 g/dL — ABNORMAL LOW (ref 13.0–17.0)
MCH: 33.4 pg (ref 26.0–34.0)
MCHC: 34.4 g/dL (ref 30.0–36.0)
MCV: 97.1 fL (ref 80.0–100.0)
Platelets: 168 10*3/uL (ref 150–400)
RBC: 3.47 MIL/uL — ABNORMAL LOW (ref 4.22–5.81)
RDW: 13.2 % (ref 11.5–15.5)
WBC: 8 10*3/uL (ref 4.0–10.5)
nRBC: 0 % (ref 0.0–0.2)

## 2019-04-14 LAB — BASIC METABOLIC PANEL
Anion gap: 8 (ref 5–15)
BUN: 5 mg/dL — ABNORMAL LOW (ref 6–20)
CO2: 21 mmol/L — ABNORMAL LOW (ref 22–32)
Calcium: 8.2 mg/dL — ABNORMAL LOW (ref 8.9–10.3)
Chloride: 106 mmol/L (ref 98–111)
Creatinine, Ser: 0.41 mg/dL — ABNORMAL LOW (ref 0.61–1.24)
GFR calc Af Amer: >60 mL/min (ref >60)
GFR calc non Af Amer: >60 mL/min (ref >60)
Glucose, Bld: 111 mg/dL — ABNORMAL HIGH (ref 70–99)
Potassium: 3.4 mmol/L — ABNORMAL LOW (ref 3.5–5.1)
Sodium: 135 mmol/L (ref 135–145)

## 2019-04-14 LAB — PHOSPHORUS: Phosphorus: 4.1 mg/dL (ref 2.5–4.6)

## 2019-04-14 LAB — MAGNESIUM: Magnesium: 1.5 mg/dL — ABNORMAL LOW (ref 1.7–2.4)

## 2019-04-15 LAB — URINALYSIS, ROUTINE W REFLEX MICROSCOPIC
Bilirubin Urine: NEGATIVE
Glucose, UA: NEGATIVE mg/dL
Ketones, ur: NEGATIVE mg/dL
Nitrite: POSITIVE — AB
Protein, ur: 30 mg/dL — AB
Specific Gravity, Urine: 1.016 (ref 1.005–1.030)
pH: 6 (ref 5.0–8.0)

## 2019-04-15 LAB — POTASSIUM: Potassium: 3.4 mmol/L — ABNORMAL LOW (ref 3.5–5.1)

## 2019-04-15 LAB — MAGNESIUM: Magnesium: 1.7 mg/dL (ref 1.7–2.4)

## 2019-04-16 LAB — POTASSIUM: Potassium: 3.9 mmol/L (ref 3.5–5.1)

## 2019-04-16 LAB — MAGNESIUM: Magnesium: 1.5 mg/dL — ABNORMAL LOW (ref 1.7–2.4)

## 2019-04-17 LAB — URINE CULTURE: Culture: >=100000 — AB

## 2019-04-17 LAB — MAGNESIUM: Magnesium: 1.6 mg/dL — ABNORMAL LOW (ref 1.7–2.4)

## 2019-04-17 LAB — AMMONIA: Ammonia: 55 umol/L — ABNORMAL HIGH (ref 9–35)

## 2019-04-18 LAB — BASIC METABOLIC PANEL
Anion gap: 8 (ref 5–15)
BUN: <5 mg/dL — ABNORMAL LOW (ref 6–20)
CO2: 22 mmol/L (ref 22–32)
Calcium: 8.1 mg/dL — ABNORMAL LOW (ref 8.9–10.3)
Chloride: 108 mmol/L (ref 98–111)
Creatinine, Ser: 0.44 mg/dL — ABNORMAL LOW (ref 0.61–1.24)
GFR calc Af Amer: >60 mL/min (ref >60)
GFR calc non Af Amer: >60 mL/min (ref >60)
Glucose, Bld: 140 mg/dL — ABNORMAL HIGH (ref 70–99)
Potassium: 3.5 mmol/L (ref 3.5–5.1)
Sodium: 138 mmol/L (ref 135–145)

## 2019-04-18 LAB — CBC
HCT: 32.2 % — ABNORMAL LOW (ref 39.0–52.0)
Hemoglobin: 11 g/dL — ABNORMAL LOW (ref 13.0–17.0)
MCH: 33.6 pg (ref 26.0–34.0)
MCHC: 34.2 g/dL (ref 30.0–36.0)
MCV: 98.5 fL (ref 80.0–100.0)
Platelets: 145 10*3/uL — ABNORMAL LOW (ref 150–400)
RBC: 3.27 MIL/uL — ABNORMAL LOW (ref 4.22–5.81)
RDW: 13.2 % (ref 11.5–15.5)
WBC: 6.7 10*3/uL (ref 4.0–10.5)
nRBC: 0 % (ref 0.0–0.2)

## 2019-04-18 LAB — MAGNESIUM: Magnesium: 1.4 mg/dL — ABNORMAL LOW (ref 1.7–2.4)

## 2019-04-19 LAB — MAGNESIUM: Magnesium: 1.9 mg/dL (ref 1.7–2.4)

## 2019-04-21 LAB — MAGNESIUM: Magnesium: 1.4 mg/dL — ABNORMAL LOW (ref 1.7–2.4)

## 2019-04-21 LAB — SARS CORONAVIRUS 2 BY RT PCR (HOSPITAL ORDER, PERFORMED IN ~~LOC~~ HOSPITAL LAB): SARS Coronavirus 2: NEGATIVE

## 2019-04-21 LAB — BASIC METABOLIC PANEL
Anion gap: 6 (ref 5–15)
BUN: <5 mg/dL — ABNORMAL LOW (ref 6–20)
CO2: 23 mmol/L (ref 22–32)
Calcium: 7.9 mg/dL — ABNORMAL LOW (ref 8.9–10.3)
Chloride: 109 mmol/L (ref 98–111)
Creatinine, Ser: 0.42 mg/dL — ABNORMAL LOW (ref 0.61–1.24)
GFR calc Af Amer: >60 mL/min (ref >60)
GFR calc non Af Amer: >60 mL/min (ref >60)
Glucose, Bld: 108 mg/dL — ABNORMAL HIGH (ref 70–99)
Potassium: 3.5 mmol/L (ref 3.5–5.1)
Sodium: 138 mmol/L (ref 135–145)

## 2019-04-21 LAB — AMMONIA: Ammonia: 67 umol/L — ABNORMAL HIGH (ref 9–35)

## 2020-03-10 DEATH — deceased

## 2021-03-08 IMAGING — CT CT ABDOMEN W/O CM
2 of 4 series · 14 of 46 positions shown, 16 images · non-contrast
Comparison: Right upper quadrant abdominal ultrasound-03/01/2019

CLINICAL DATA: Evaluate anatomy prior to potential percutaneous
gastrostomy tube placement.

EXAM:
CT ABDOMEN WITHOUT CONTRAST
TECHNIQUE: Multidetector CT imaging of the abdomen was performed following the
standard protocol without IV contrast.

[Series 3: abd/ pelvis 5.0 i30f 2 · axial · 0.98mm/px · z∈[+1126,+1426]mm · 11 of 68 slices shown, 13 images]
[im 4/68  soft-tissue]
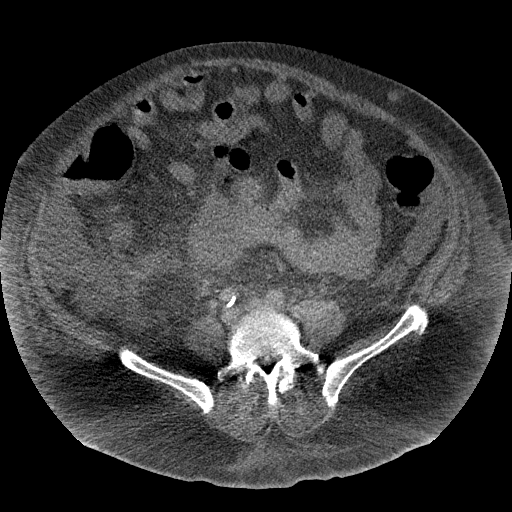
[im 4/68  bone]
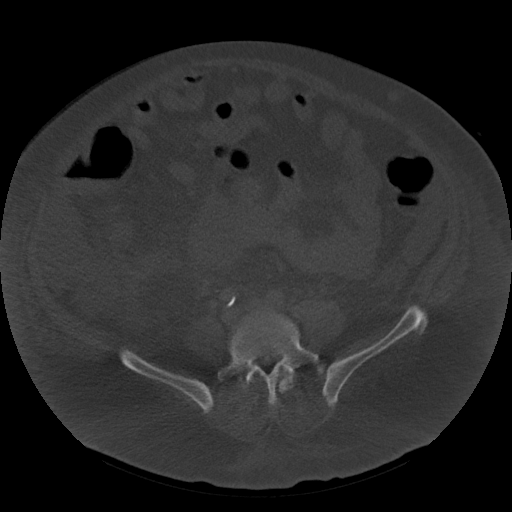
[im 10/68  soft-tissue]
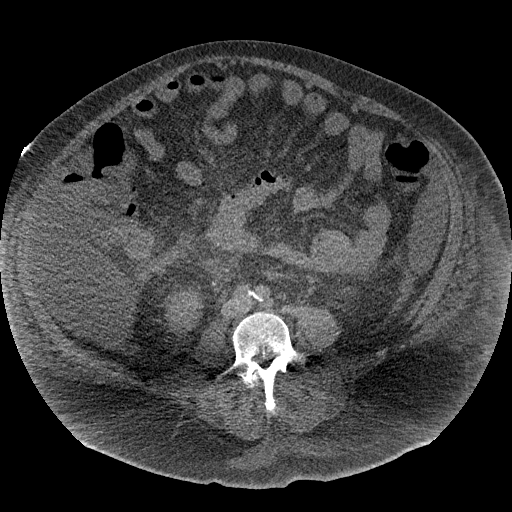
[im 16/68  soft-tissue]
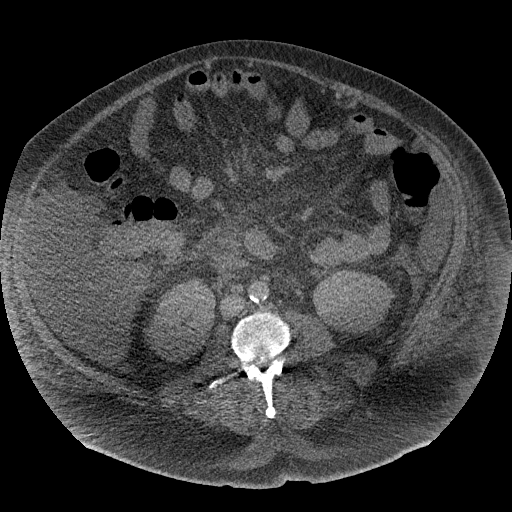
[im 23/68  soft-tissue]
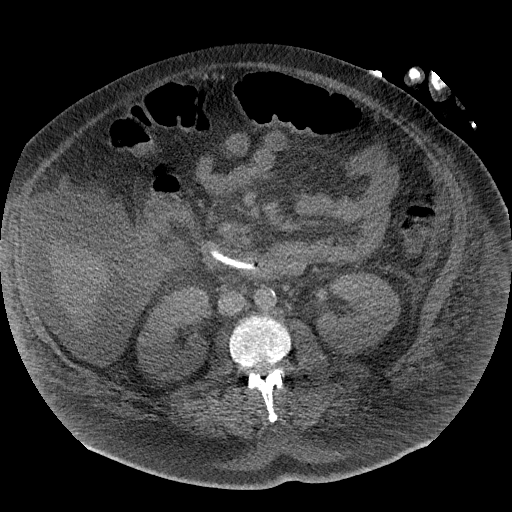
[im 29/68  soft-tissue]
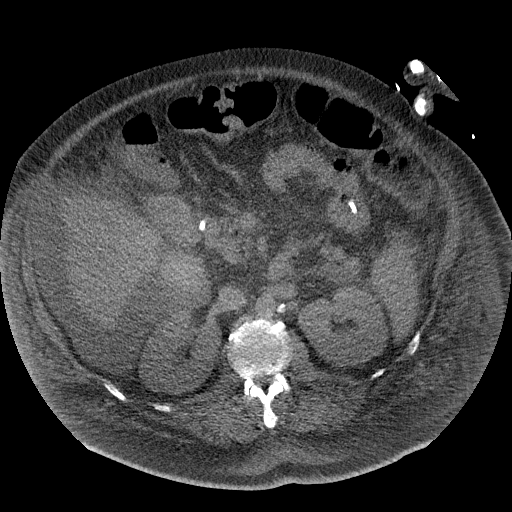
[im 36/68  soft-tissue]
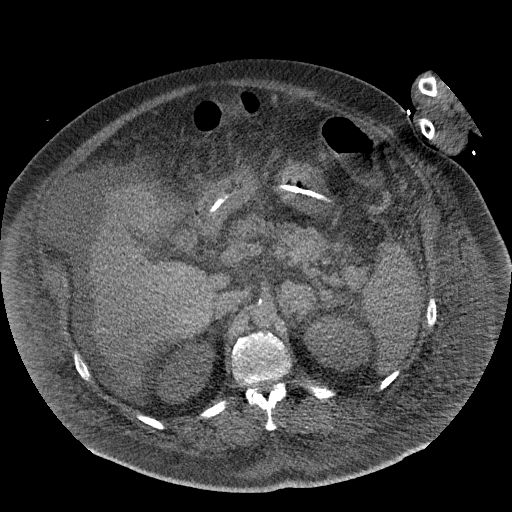
[im 39/68  soft-tissue]
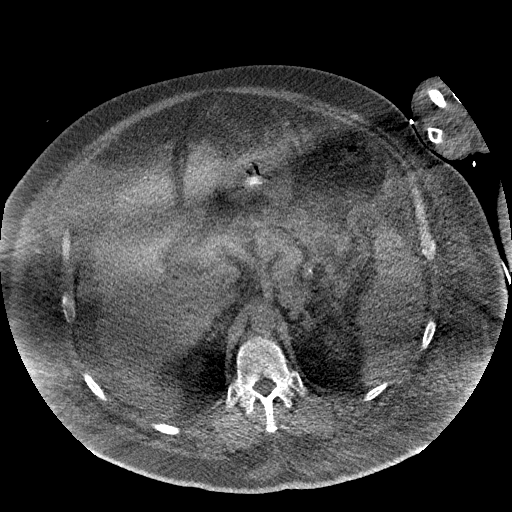
[im 45/68  soft-tissue]
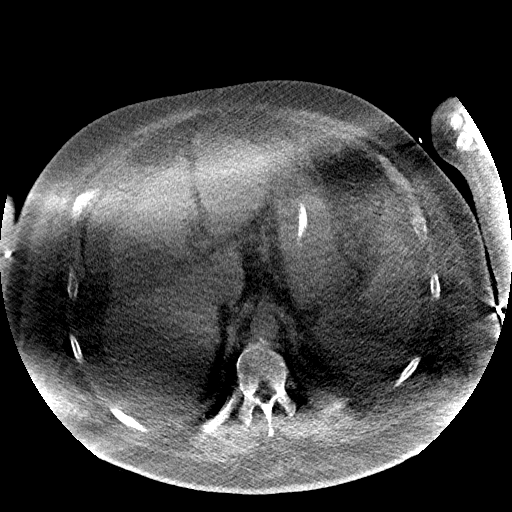
[im 52/68  soft-tissue]
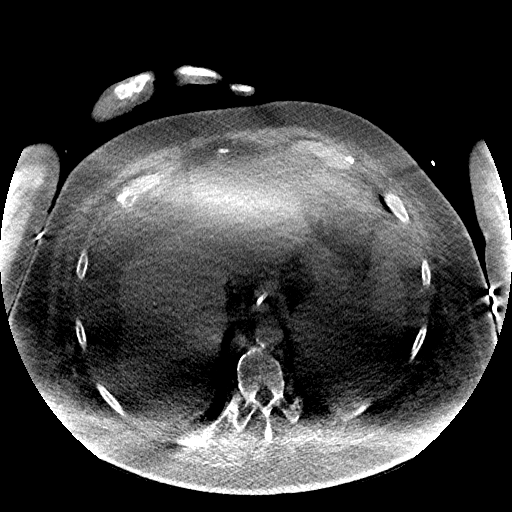
[im 52/68  bone]
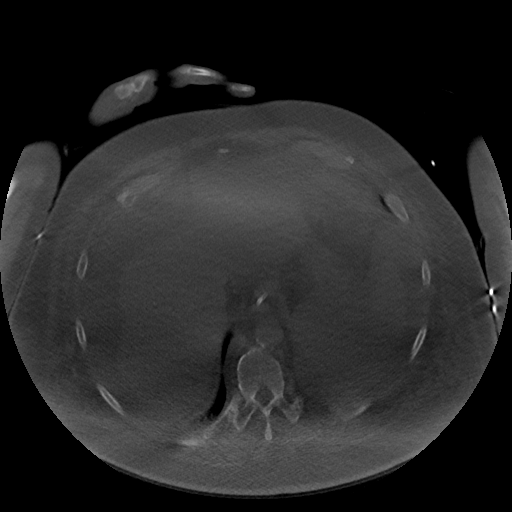
[im 58/68  soft-tissue]
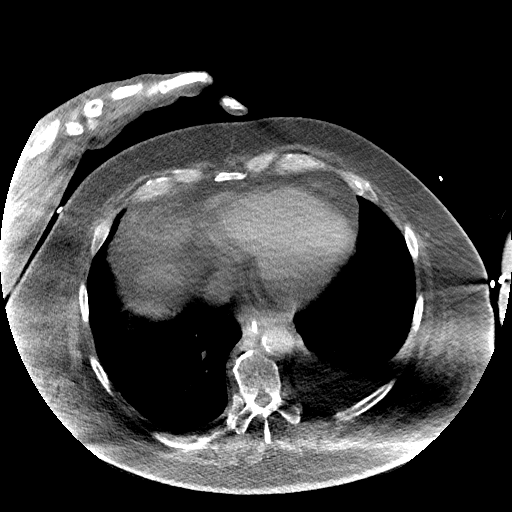
[im 64/68  soft-tissue]
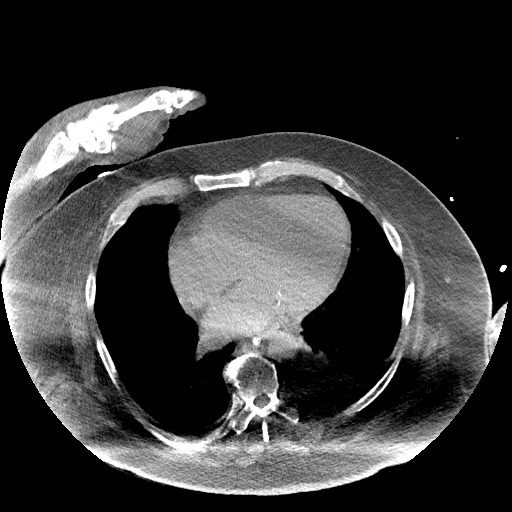

[Series 6: cor st · coronal · 0.68mm/px · 3 of 155 slices shown]
[im 52/155  soft-tissue]
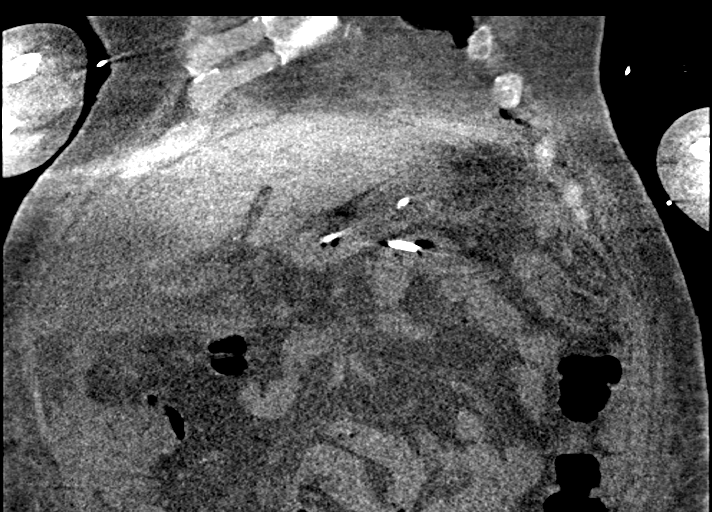
[im 69/155  soft-tissue]
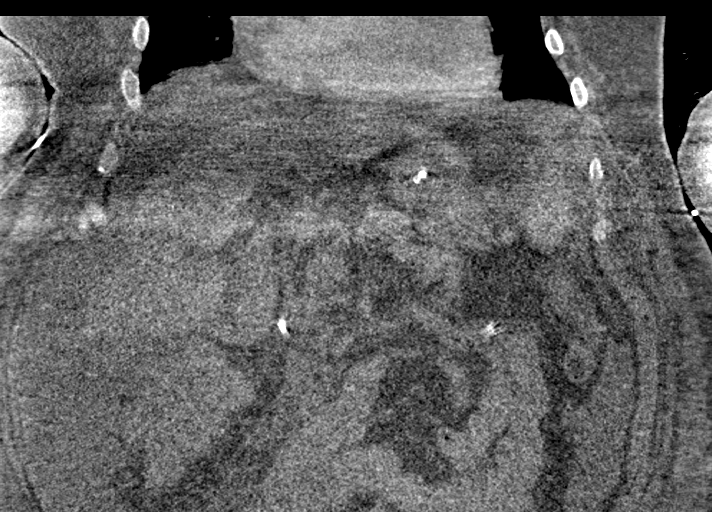
[im 86/155  soft-tissue]
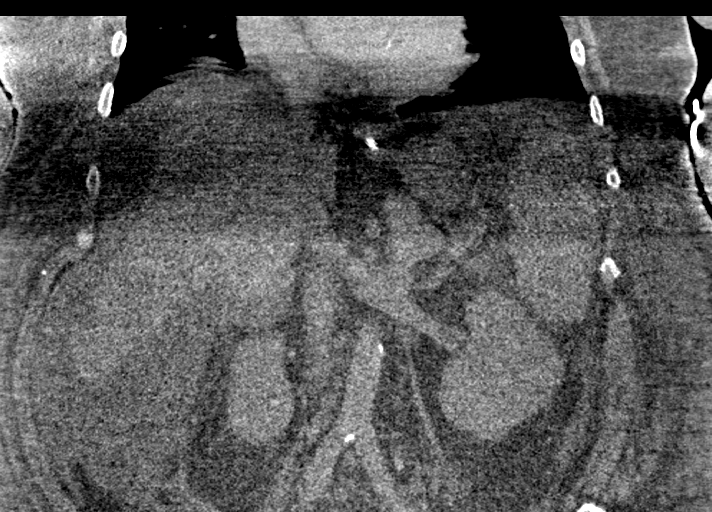

[14 of 46 positions shown; findings below may reference images not displayed]

FINDINGS: Evaluation the abdominal organs is limited secondary lack of
intravenous contrast. Examination is further degraded secondary to
patient motion artifact as well as quantum mottle artifact due to
patient's overlying upper extremities.

Lower chest: Limited visualization of lower thorax demonstrates a
trace right and small left-sided pleural effusions with associated
bibasilar atelectasis.

Cardiomegaly. Calcifications within the mitral valve annulus. No
pericardial effusion.

Hepatobiliary: Nodularity of the hepatic contour suggestive of
hepatic steatosis. This finding is associated with small amount of
perihepatic ascites. High-density material within the gallbladder
could represent vicarious excretion of contrast from previous
contrast examination as no definitive gallstones were seen on recent
right upper quadrant abdominal ultrasound.

Pancreas: Normal noncontrast appearance of the pancreas.

Spleen: Normal noncontrast appearance of the spleen. Note is made of
a small amount of perisplenic fluid.

Adrenals/Urinary Tract: Normal noncontrast appearance the bilateral
kidneys. No renal stones. No urine obstruction. Note is made of an
approximately 3.9 x 3.9 cm potential left adrenal gland nodule
versus hypertrophied splenorenal shunt, suboptimally evaluated on
this noncontrast examination. Normal noncontrast appearance of the
right adrenal gland. Urinary bladder was not imaged.

Stomach/Bowel: The stomach is suboptimally apposed against the
ventral wall of the upper abdomen at a depth of approximately 8.5 cm
(image 30, series 3). In addition, there is partial transposition of
the transverse colon between the ventral wall of the abdomen and
anterior wall of the stomach

Enteric tube tip terminates within the proximal small bowel. No
definite evidence of enteric obstruction. No pneumoperitoneum,
pneumatosis or portal venous gas.

Vascular/Lymphatic: Atherosclerotic plaque within a normal caliber
abdominal aorta.

No bulky retroperitoneal, mesenteric, pelvic or inguinal
lymphadenopathy on this noncontrast examination.

Other: Diffuse body wall anasarca.

Musculoskeletal: No acute or aggressive osseous abnormalities.
IMPRESSION: 1. Poor candidate for gastrostomy tube placement secondary to
cirrhosis and intra-abdominal ascites as gastric anatomy including
the depth of the stomach and partial transposition of the transverse
colon.
2.  Aortic Atherosclerosis ([id number]).
3. Potential 3.9 cm left adrenal gland nodule/mass versus
hypertrophied splenorenal shunt (as a sequela of cirrhosis and
portal venous hypertension), suboptimally evaluated on this
noncontrast examination. Comparison with prior outside examinations
(if available), is advised. Otherwise, further evaluation with
nonemergent contrast-enhanced exam could be performed as indicated.
